# Patient Record
Sex: Female | Born: 1965 | Race: White | Hispanic: No | Marital: Married | Smoking: Never smoker
Health system: Southern US, Community
[De-identification: ages and names within clinical notes are randomized; demographics above are authoritative.]

---

## 1998-12-18 ENCOUNTER — Inpatient Hospital Stay (HOSPITAL_COMMUNITY): Admission: AD | Admit: 1998-12-18 | Discharge: 1998-12-21 | Payer: Self-pay | Admitting: Obstetrics and Gynecology

## 1999-01-22 ENCOUNTER — Other Ambulatory Visit: Admission: RE | Admit: 1999-01-22 | Discharge: 1999-01-22 | Payer: Self-pay | Admitting: Obstetrics and Gynecology

## 2014-05-10 LAB — HM PAP SMEAR: HM Pap smear: NEGATIVE

## 2014-06-30 ENCOUNTER — Emergency Department (HOSPITAL_COMMUNITY): Payer: 59

## 2014-06-30 ENCOUNTER — Encounter (HOSPITAL_COMMUNITY): Payer: Self-pay | Admitting: *Deleted

## 2014-06-30 ENCOUNTER — Emergency Department (HOSPITAL_COMMUNITY)
Admission: EM | Admit: 2014-06-30 | Discharge: 2014-06-30 | Disposition: A | Discharge status: Home / Self Care | Payer: 59 | Attending: Emergency Medicine | Admitting: Emergency Medicine

## 2014-06-30 DIAGNOSIS — S022XXA Fracture of nasal bones, initial encounter for closed fracture: Secondary | ICD-10-CM | POA: Insufficient documentation

## 2014-06-30 DIAGNOSIS — W228XXA Striking against or struck by other objects, initial encounter: Secondary | ICD-10-CM | POA: Diagnosis not present

## 2014-06-30 DIAGNOSIS — Y998 Other external cause status: Secondary | ICD-10-CM | POA: Diagnosis not present

## 2014-06-30 DIAGNOSIS — Z880 Allergy status to penicillin: Secondary | ICD-10-CM | POA: Diagnosis not present

## 2014-06-30 DIAGNOSIS — S0993XA Unspecified injury of face, initial encounter: Secondary | ICD-10-CM | POA: Diagnosis present

## 2014-06-30 DIAGNOSIS — Y9289 Other specified places as the place of occurrence of the external cause: Secondary | ICD-10-CM | POA: Diagnosis not present

## 2014-06-30 DIAGNOSIS — Y9389 Activity, other specified: Secondary | ICD-10-CM | POA: Insufficient documentation

## 2014-06-30 MED ORDER — IBUPROFEN 200 MG PO TABS
600.0000 mg | ORAL_TABLET | Freq: Once | ORAL | Status: AC
  Administered 2014-06-30: 600 mg via ORAL
  Filled 2014-06-30 (×2): qty 1

## 2014-06-30 MED ORDER — HYDROCODONE-ACETAMINOPHEN 5-325 MG PO TABS: 1.0000 | ORAL_TABLET | ORAL | Status: DC | PRN

## 2014-06-30 NOTE — Discharge Instructions (Signed)
Please follow the directions provided.  Be sure to follow-up with the Ear, Nose and Throat doctor to further manage your broken nose.  You may take ibuprofen for pain or vicodin for pain not helped by the ibuprofen.  Use the ice pack for swelling.  Don't hesitate to return for any new, worsening or concerning symptoms.     SEEK IMMEDIATE MEDICAL CARE IF:  You have bleeding from your nose that does not stop after 20 minutes of pinching the nostrils closed and keeping ice on the nose.  You have clear fluid draining from your nose.  You notice a grape-like swelling on the dividing wall between the nostrils (septum). This is a collection of blood (hematoma) that must be drained to help prevent infection.  You have difficulty moving your eyes.  You have recurrent vomiting.

## 2014-06-30 NOTE — ED Notes (Signed)
Pt reports working on a project, pulled on something and unsure if wood or hammer hit her nose. Having nose bleed and has bruising/abrasion to nose. Reports dizziness since accident, denies loc.

## 2014-06-30 NOTE — ED Provider Notes (Signed)
CSN: [id number]     Arrival date & time 06/30/14  1302 History   First MD Initiated Contact with Patient 06/30/14 1313     Chief Complaint  Patient presents with  . Facial Injury   (Consider location/radiation/quality/duration/timing/severity/associated sxs/prior Treatment) HPI  Karen DebarChristine Bautista is a 49 yo female presenting with injury to nose.  She states she was taking down a playhouse and while pulling with the hammer, the hammer struck her in the bridge of the nose.  She did not lose consciousness but her nose immediately began bleeding.  She put ice across the bridge of her nose at the time of the accident and reports it has helped with the pain and swelling. The bleeding is controlled now also. She currently reports a throbbing pain but only rates it as 2-3/10.  She denies any nausea, vomiting or changes in mental status.     History reviewed. No pertinent past medical history. History reviewed. No pertinent past surgical history. History reviewed. No pertinent family history. History  Substance Use Topics  . Smoking status: Not on file  . Smokeless tobacco: Not on file  . Alcohol Use: Yes   OB History    No data available     Review of Systems  Gastrointestinal: Negative for nausea.  Musculoskeletal: Positive for myalgias and arthralgias.  Neurological: Negative for syncope.     Allergies  Amoxicillin  Home Medications   Prior to Admission medications   Not on File   BP 125/69 mmHg  Pulse 77  Temp(Src) 98.3 F (36.8 C) (Oral)  Resp 18  SpO2 94%  LMP 06/19/2014 Physical Exam  Constitutional: She is oriented to person, place, and time. She appears well-developed and well-nourished. No distress.  HENT:  Head: Normocephalic and atraumatic.  Nose: Sinus tenderness present. No nasal deformity.    Mouth/Throat: Oropharynx is clear and moist. No oropharyngeal exudate.  Eyes: Conjunctivae are normal. Pupils are equal, round, and reactive to light.  Neck: Neck  supple. No thyromegaly present.  Cardiovascular: Normal rate, regular rhythm and intact distal pulses.   Pulmonary/Chest: Effort normal and breath sounds normal. No respiratory distress. She has no wheezes. She has no rales. She exhibits no tenderness.  Abdominal: Soft. There is no tenderness.  Musculoskeletal: She exhibits no tenderness.  Lymphadenopathy:    She has no cervical adenopathy.  Neurological: She is alert and oriented to person, place, and time. She has normal strength. No cranial nerve deficit or sensory deficit. Coordination normal. GCS eye subscore is 4. GCS verbal subscore is 5. GCS motor subscore is 6.  Cranial nerves 2-12 intact.    Skin: Skin is warm and dry. No rash noted. She is not diaphoretic.  Psychiatric: She has a normal mood and affect.  Nursing note and vitals reviewed.   ED Course  Procedures (including critical care time) Labs Review Labs Reviewed - No data to display  Imaging Review Ct Maxillofacial Wo Cm  06/30/2014   CLINICAL DATA:  Facial trauma today.  EXAM: CT MAXILLOFACIAL WITHOUT CONTRAST  TECHNIQUE: Multidetector CT imaging of the maxillofacial structures was performed. Multiplanar CT image reconstructions were also generated. A small metallic BB was placed on the right temple in order to reliably differentiate right from left.  COMPARISON:  None.  FINDINGS: There is a fracture of the bony nasal bridge and right nasal bone. Minimal depression. The left nasal bone is intact. The bony nasal septum is intact.  No other facial bone fractures are identified. The paranasal sinuses and  mastoid air cells are clear.  The mandibular condyles are normally located. The upper cervical spine is normal. Moderate degenerative changes noted at C1-2.  IMPRESSION: Nasal bone fractures as described above.  No other facial bone fractures are demonstrated.   Electronically Signed   By: Rudie Meyer M.D.   On: 06/30/2014 14:56     EKG Interpretation None      MDM    Final diagnoses:  Nasal fracture, closed, initial encounter   49 yo with non-displaced nasal fracture identified on CT.  She has no difficulty breathing and no indication of septal hematoma. Her pain was managed in the ED. Pt is well-appearing, in no acute distress and vital signs reviewed and not concerning. She appears safe to be discharged.  Discharge include follow-up with their ENT.  Return precautions provided. Pt aware of plan and in agreement.   Filed Vitals:   06/30/14 1415 06/30/14 1500 06/30/14 1515 06/30/14 1525  BP: 121/72 117/68 116/73 116/73  Pulse: 64 85 86 83  Temp:      TempSrc:      Resp:    16  SpO2: 98% 97% 97% 97%   Meds given in ED:  Medications  ibuprofen (ADVIL,MOTRIN) tablet 600 mg (600 mg Oral Given 06/30/14 1401)    Discharge Medication List as of 06/30/2014  3:30 PM    START taking these medications   Details  HYDROcodone-acetaminophen (NORCO/VICODIN) 5-325 MG per tablet Take 1 tablet by mouth every 4 (four) hours as needed for moderate pain or severe pain., Starting 06/30/2014, Until Discontinued, Print          Harle Battiest, NP 07/01/14 1106  Suzi Roots, MD 07/05/14 [phone]

## 2015-04-27 IMAGING — CT CT MAXILLOFACIAL W/O CM
3 of 4 series · 9 of 47 positions shown, 10 images · non-contrast
Comparison: None.

CLINICAL DATA: Facial trauma today.

EXAM:
CT MAXILLOFACIAL WITHOUT CONTRAST
TECHNIQUE: Multidetector CT imaging of the maxillofacial structures was
performed. Multiplanar CT image reconstructions were also generated.
A small metallic BB was placed on the right temple in order to
reliably differentiate right from left.

[Series 205: cor st · coronal · 0.33mm/px · 3 of 59 slices shown, 4 images]
[im 15/59  brain]
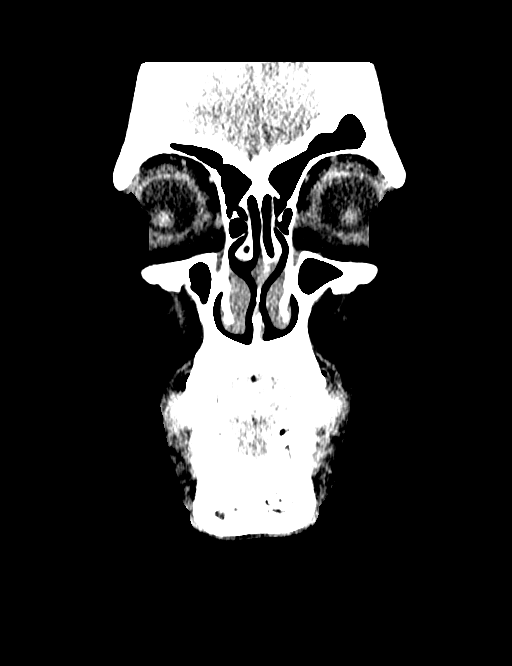
[im 15/59  bone]
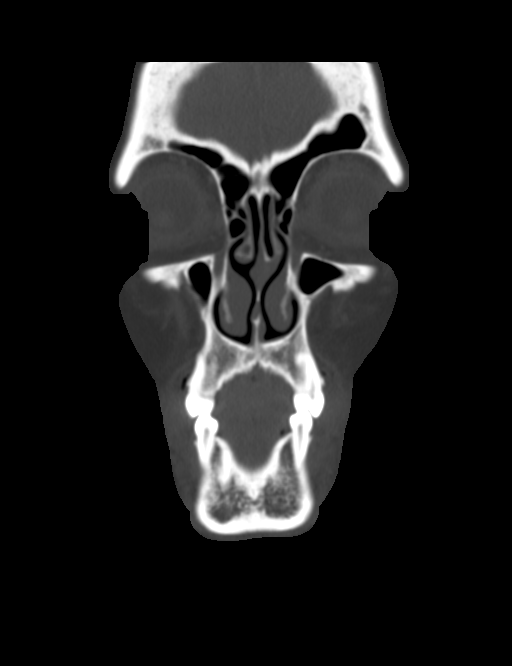
[im 30/59  bone]
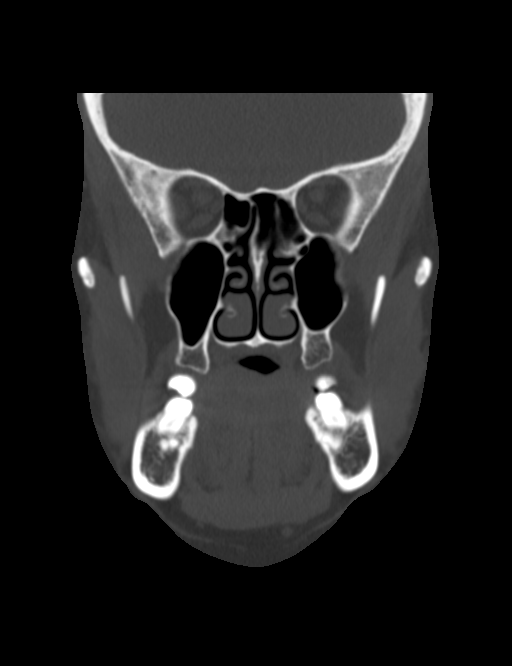
[im 44/59  bone]
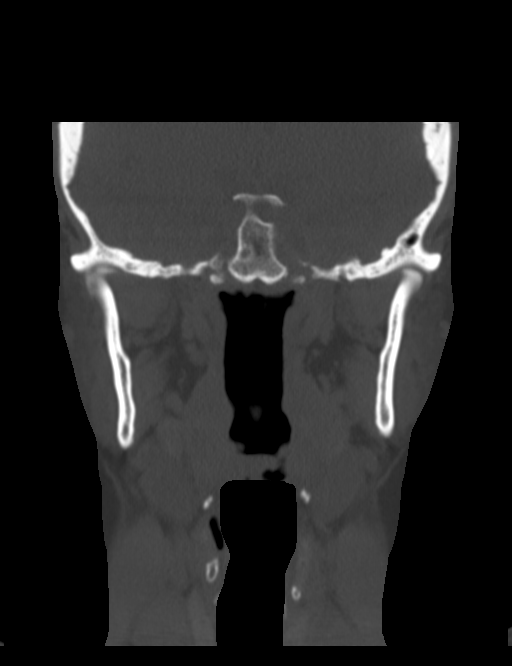

[Series 206: sag st · sagittal · 0.33mm/px · 3 of 71 slices shown]
[im 24/71  bone]
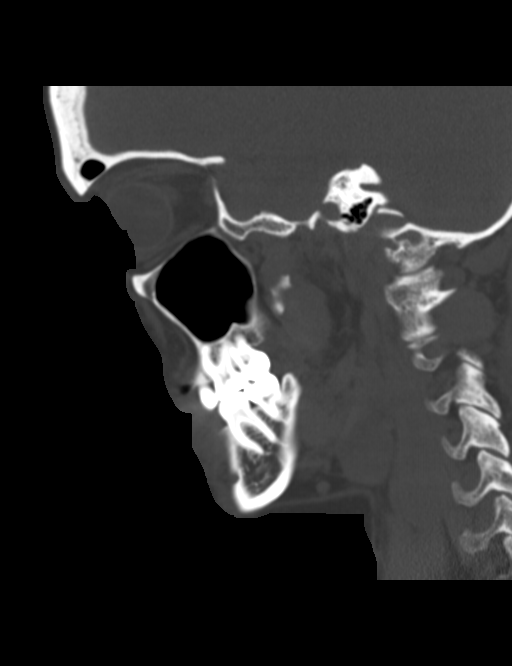
[im 36/71  bone]
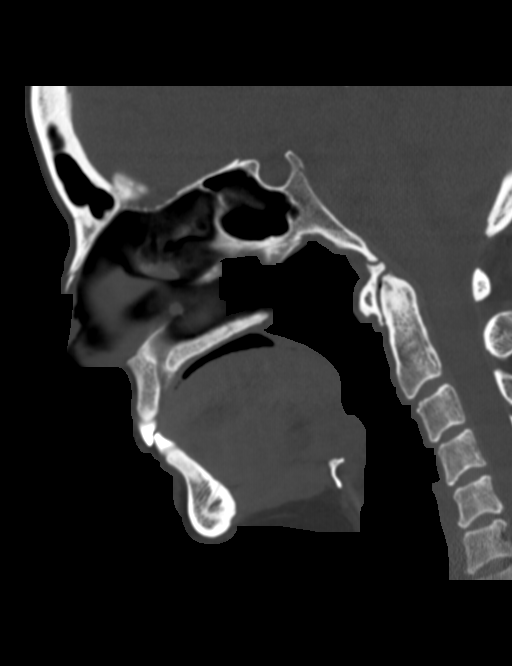
[im 47/71  bone]
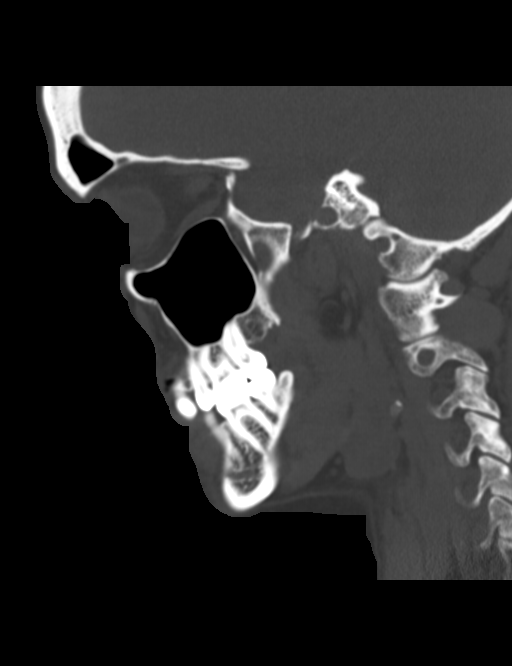

[Series 207: cor bone · coronal · 0.33mm/px · 3 of 58 slices shown]
[im 15/58  bone]
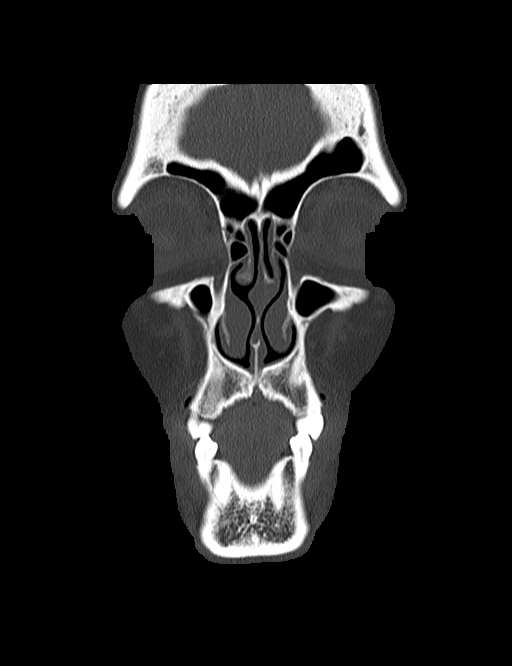
[im 29/58  bone]
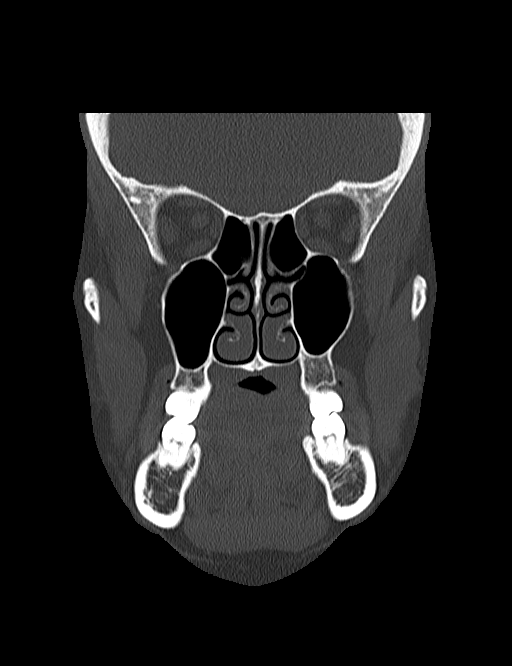
[im 43/58  bone]
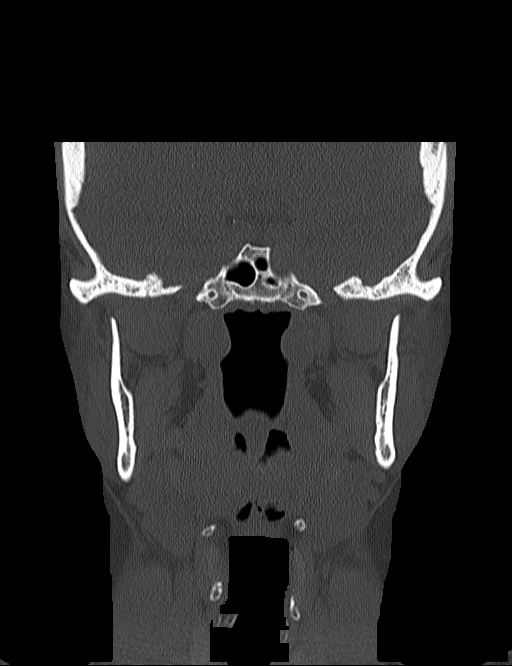

[9 of 47 positions shown; findings below may reference images not displayed]

FINDINGS: There is a fracture of the bony nasal bridge and right nasal bone.
Minimal depression. The left nasal bone is intact. The bony nasal
septum is intact.

No other facial bone fractures are identified. The paranasal sinuses
and mastoid air cells are clear.

The mandibular condyles are normally located. The upper cervical
spine is normal. Moderate degenerative changes noted at C1-2.
IMPRESSION: Nasal bone fractures as described above.

No other facial bone fractures are demonstrated.

## 2016-04-15 LAB — HM MAMMOGRAPHY

## 2016-05-20 DIAGNOSIS — Z1211 Encounter for screening for malignant neoplasm of colon: Secondary | ICD-10-CM | POA: Diagnosis not present

## 2016-07-07 DIAGNOSIS — K5730 Diverticulosis of large intestine without perforation or abscess without bleeding: Secondary | ICD-10-CM | POA: Diagnosis not present

## 2016-07-07 DIAGNOSIS — Z1211 Encounter for screening for malignant neoplasm of colon: Secondary | ICD-10-CM | POA: Diagnosis not present

## 2017-03-10 ENCOUNTER — Ambulatory Visit: Payer: 59 | Admitting: Family Medicine

## 2017-03-17 ENCOUNTER — Encounter: Payer: Self-pay | Admitting: Family Medicine

## 2017-03-17 ENCOUNTER — Ambulatory Visit (INDEPENDENT_AMBULATORY_CARE_PROVIDER_SITE_OTHER): Payer: 59 | Admitting: Family Medicine

## 2017-03-17 VITALS — BP 120/80 | HR 90 | Temp 98.5°F | Ht 65.25 in | Wt 174.9 lb

## 2017-03-17 DIAGNOSIS — Z1159 Encounter for screening for other viral diseases: Secondary | ICD-10-CM | POA: Diagnosis not present

## 2017-03-17 DIAGNOSIS — F458 Other somatoform disorders: Secondary | ICD-10-CM | POA: Diagnosis not present

## 2017-03-17 DIAGNOSIS — Z8719 Personal history of other diseases of the digestive system: Secondary | ICD-10-CM

## 2017-03-17 DIAGNOSIS — R0989 Other specified symptoms and signs involving the circulatory and respiratory systems: Secondary | ICD-10-CM

## 2017-03-17 DIAGNOSIS — Z23 Encounter for immunization: Secondary | ICD-10-CM

## 2017-03-17 DIAGNOSIS — Z0000 Encounter for general adult medical examination without abnormal findings: Secondary | ICD-10-CM | POA: Diagnosis not present

## 2017-03-17 DIAGNOSIS — Z1331 Encounter for screening for depression: Secondary | ICD-10-CM | POA: Diagnosis not present

## 2017-03-17 LAB — LIPID PANEL
CHOL/HDL RATIO: 3
CHOLESTEROL: 242 mg/dL — AB (ref 0–200)
HDL: 82.5 mg/dL (ref >39.00)
LDL Cholesterol: 142 mg/dL — ABNORMAL HIGH (ref 0–99)
NonHDL: 159.24
TRIGLYCERIDES: 88 mg/dL (ref 0.0–149.0)
VLDL: 17.6 mg/dL (ref 0.0–40.0)

## 2017-03-17 LAB — HEMOGLOBIN A1C: HEMOGLOBIN A1C: 5.6 % (ref 4.6–6.5)

## 2017-03-17 NOTE — Patient Instructions (Addendum)
BEFORE YOU LEAVE: -flu shot -depression screening for physical -labs -follow up: yearly for CPE and as needed  We have ordered labs or studies at this visit. It can take up to 1-2 weeks for results and processing. IF results require follow up or explanation, we will call you with instructions. Clinically stable results will be released to your [facility]. If you have not heard from [place] or cannot find your results in [facility] in 2 weeks please contact our office at [phone].  If you are not yet signed up for [facility], please consider signing up.  WE NOW OFFER   [facility] FAST TRACK!!!  SAME DAY Appointments for ACUTE CARE  Such as: Sprains, Injuries, cuts, abrasions, rashes, muscle pain, joint pain, back pain Colds, flu, sore throats, headache, allergies, cough, fever  Ear pain, sinus and eye infections Abdominal pain, nausea, vomiting, diarrhea, upset stomach Animal/insect bites  3 Easy Ways to Schedule: Walk-In Scheduling - will be offered next available appointment time Call in scheduling Mychart Sign-up: [email or web address]    Health Maintenance for Postmenopausal Women Menopause is a normal process in which your reproductive ability comes to an end. This process happens gradually over a span of months to years, usually between the ages of 60 and 40. Menopause is complete when you have missed 12 consecutive menstrual periods. It is important to talk with your health care provider about some of the most common conditions that affect postmenopausal women, such as heart disease, cancer, and bone loss (osteoporosis). Adopting a healthy lifestyle and getting preventive care can help to promote your health and wellness. Those actions can also lower your chances of developing some of these common conditions. What should I know about menopause? During menopause, you may experience a number of symptoms, such as:  Moderate-to-severe hot flashes.  Night  sweats.  Decrease in sex drive.  Mood swings.  Headaches.  Tiredness.  Irritability.  Memory problems.  Insomnia.  Choosing to treat or not to treat menopausal changes is an individual decision that you make with your health care provider. What should I know about hormone replacement therapy and supplements? Hormone therapy products are effective for treating symptoms that are associated with menopause, such as hot flashes and night sweats. Hormone replacement carries certain risks, especially as you become older. If you are thinking about using estrogen or estrogen with progestin treatments, discuss the benefits and risks with your health care provider. What should I know about heart disease and stroke? Heart disease, heart attack, and stroke become more likely as you age. This may be due, in part, to the hormonal changes that your body experiences during menopause. These can affect how your body processes dietary fats, triglycerides, and cholesterol. Heart attack and stroke are both medical emergencies. There are many things that you can do to help prevent heart disease and stroke:  Have your blood pressure checked at least every 1-2 years. High blood pressure causes heart disease and increases the risk of stroke.  If you are 54-86 years old, ask your health care provider if you should take aspirin to prevent a heart attack or a stroke.  Do not use any tobacco products, including cigarettes, chewing tobacco, or electronic cigarettes. If you need help quitting, ask your health care provider.  It is important to eat a healthy diet and maintain a healthy weight. ? Be sure to include plenty of vegetables, fruits, low-fat dairy products, and lean protein. ? Avoid eating foods that are high in solid fats, added sugars,  or salt (sodium).  Get regular exercise. This is one of the most important things that you can do for your health. ? Try to exercise for at least 150 minutes each week.  The type of exercise that you do should increase your heart rate and make you sweat. This is known as moderate-intensity exercise. ? Try to do strengthening exercises at least twice each week. Do these in addition to the moderate-intensity exercise.  Know your numbers.Ask your health care provider to check your cholesterol and your blood glucose. Continue to have your blood tested as directed by your health care provider.  What should I know about cancer screening? There are several types of cancer. Take the following steps to reduce your risk and to catch any cancer development as early as possible. Breast Cancer  Practice breast self-awareness. ? This means understanding how your breasts normally appear and feel. ? It also means doing regular breast self-exams. Let your health care provider know about any changes, no matter how small.  If you are 23 or older, have a clinician do a breast exam (clinical breast exam or CBE) every year. Depending on your age, family history, and medical history, it may be recommended that you also have a yearly breast X-ray (mammogram).  If you have a family history of breast cancer, talk with your health care provider about genetic screening.  If you are at high risk for breast cancer, talk with your health care provider about having an MRI and a mammogram every year.  Breast cancer (BRCA) gene test is recommended for women who have family members with BRCA-related cancers. Results of the assessment will determine the need for genetic counseling and BRCA1 and for BRCA2 testing. BRCA-related cancers include these types: ? Breast. This occurs in males or females. ? Ovarian. ? Tubal. This may also be called fallopian tube cancer. ? Cancer of the abdominal or pelvic lining (peritoneal cancer). ? Prostate. ? Pancreatic.  Cervical, Uterine, and Ovarian Cancer Your health care provider may recommend that you be screened regularly for cancer of the pelvic  organs. These include your ovaries, uterus, and vagina. This screening involves a pelvic exam, which includes checking for microscopic changes to the surface of your cervix (Pap test).  For women ages 21-65, health care providers may recommend a pelvic exam and a Pap test every three years. For women ages 40-65, they may recommend the Pap test and pelvic exam, combined with testing for human papilloma virus (HPV), every five years. Some types of HPV increase your risk of cervical cancer. Testing for HPV may also be done on women of any age who have unclear Pap test results.  Other health care providers may not recommend any screening for nonpregnant women who are considered low risk for pelvic cancer and have no symptoms. Ask your health care provider if a screening pelvic exam is right for you.  If you have had past treatment for cervical cancer or a condition that could lead to cancer, you need Pap tests and screening for cancer for at least 20 years after your treatment. If Pap tests have been discontinued for you, your risk factors (such as having a new sexual partner) need to be reassessed to determine if you should start having screenings again. Some women have medical problems that increase the chance of getting cervical cancer. In these cases, your health care provider may recommend that you have screening and Pap tests more often.  If you have a family history of  uterine cancer or ovarian cancer, talk with your health care provider about genetic screening.  If you have vaginal bleeding after reaching menopause, tell your health care provider.  There are currently no reliable tests available to screen for ovarian cancer.  Lung Cancer Lung cancer screening is recommended for adults 45-87 years old who are at high risk for lung cancer because of a history of smoking. A yearly low-dose CT scan of the lungs is recommended if you:  Currently smoke.  Have a history of at least 30 pack-years of  smoking and you currently smoke or have quit within the past 15 years. A pack-year is smoking an average of one pack of cigarettes per day for one year.  Yearly screening should:  Continue until it has been 15 years since you quit.  Stop if you develop a health problem that would prevent you from having lung cancer treatment.  Colorectal Cancer  This type of cancer can be detected and can often be prevented.  Routine colorectal cancer screening usually begins at age 21 and continues through age 2.  If you have risk factors for colon cancer, your health care provider may recommend that you be screened at an earlier age.  If you have a family history of colorectal cancer, talk with your health care provider about genetic screening.  Your health care provider may also recommend using home test kits to check for hidden blood in your stool.  A small camera at the end of a tube can be used to examine your colon directly (sigmoidoscopy or colonoscopy). This is done to check for the earliest forms of colorectal cancer.  Direct examination of the colon should be repeated every 5-10 years until age 71. However, if early forms of precancerous polyps or small growths are found or if you have a family history or genetic risk for colorectal cancer, you may need to be screened more often.  Skin Cancer  Check your skin from head to toe regularly.  Monitor any moles. Be sure to tell your health care provider: ? About any new moles or changes in moles, especially if there is a change in a mole's shape or color. ? If you have a mole that is larger than the size of a pencil eraser.  If any of your family members has a history of skin cancer, especially at a young age, talk with your health care provider about genetic screening.  Always use sunscreen. Apply sunscreen liberally and repeatedly throughout the day.  Whenever you are outside, protect yourself by wearing long sleeves, pants, a wide-brimmed  hat, and sunglasses.  What should I know about osteoporosis? Osteoporosis is a condition in which bone destruction happens more quickly than new bone creation. After menopause, you may be at an increased risk for osteoporosis. To help prevent osteoporosis or the bone fractures that can happen because of osteoporosis, the following is recommended:  If you are 34-5 years old, get at least 1,000 mg of calcium and at least 600 mg of vitamin D per day.  If you are older than age 37 but younger than age 26, get at least 1,200 mg of calcium and at least 600 mg of vitamin D per day.  If you are older than age 9, get at least 1,200 mg of calcium and at least 800 mg of vitamin D per day.  Smoking and excessive alcohol intake increase the risk of osteoporosis. Eat foods that are rich in calcium and vitamin D, and do  weight-bearing exercises several times each week as directed by your health care provider. What should I know about how menopause affects my mental health? Depression may occur at any age, but it is more common as you become older. Common symptoms of depression include:  Low or sad mood.  Changes in sleep patterns.  Changes in appetite or eating patterns.  Feeling an overall lack of motivation or enjoyment of activities that you previously enjoyed.  Frequent crying spells.  Talk with your health care provider if you think that you are experiencing depression. What should I know about immunizations? It is important that you get and maintain your immunizations. These include:  Tetanus, diphtheria, and pertussis (Tdap) booster vaccine.  Influenza every year before the flu season begins.  Pneumonia vaccine.  Shingles vaccine.  Your health care provider may also recommend other immunizations. This information is not intended to replace advice given to you by your health care provider. Make sure you discuss any questions you have with your health care provider. Document Released:  06/18/2005 Document Revised: 11/14/2015 Document Reviewed: 01/28/2015 Elsevier Interactive Patient Education  2018 [employer].

## 2017-03-17 NOTE — Addendum Note (Signed)
Addended by: Johnella MoloneyFUNDERBURK, Siyah Mault A on: 03/17/2017 11:36 AM   Modules accepted: Orders

## 2017-03-17 NOTE — Progress Notes (Signed)
HPI:  Karen Bautista is here to establish care. Sees gynecologist (dr. Lauro Franklin) for women's health exams. Did not have a PCP for some time. Wants physical today if possible. Does not want pap/breast/skin exam. Feels well over all. Had mildly sore stomach and didn't feel well early this week, now resolved. No fever, hematochezia, melena, nausea, vomiting, diarrhea. Globus sensation 1-2 times in last 1 year, brief and resolved. No persistent GERD symptoms, dysphagia, wt loss, etc. Eats healthy. Regular exercise - very active when plays tennis.  Does breast exams, mammos, gyn exams with gynecologist. Reports UTD. Did colonoscopy at [place] this year in February. Takes vit D. Wants to check hep c as she read about this and thinks should do even though she is 1 year outside of baby boomer recs as she had several surgeries remotely.  ROS negative for unless reported above: fevers, unintentional weight loss, hearing or vision loss, chest pain, palpitations, struggling to breath, hemoptysis, melena, hematochezia, hematuria, falls, loc, si, thoughts of self harm  History reviewed. No pertinent past medical history.  History reviewed. No pertinent surgical history.  History reviewed. No pertinent family history.  Social History   Socioeconomic History  . Marital status: Married    Spouse name: None  . Number of children: None  . Years of education: None  . Highest education level: None  Social Needs  . Financial resource strain: None  . Food insecurity - worry: None  . Food insecurity - inability: None  . Transportation needs - medical: None  . Transportation needs - non-medical: None  Occupational History  . None  Tobacco Use  . Smoking status: Never Smoker  . Smokeless tobacco: Never Used  Substance and Sexual Activity  . Alcohol use: Yes  . Drug use: No  . Sexual activity: None  Other Topics Concern  . None  Social History Narrative  . None     Current  Outpatient Medications:  .  CALCIUM PO, Take by mouth., Disp: , Rfl:  .  cholecalciferol (VITAMIN D) 1000 UNITS tablet, Take 1,000 Units by mouth daily., Disp: , Rfl:  .  diphenhydramine-acetaminophen (TYLENOL PM) 25-500 MG TABS tablet, Take 1 tablet at bedtime as needed by mouth., Disp: , Rfl:  .  Multiple Vitamins-Minerals (MULTIVITAMIN WITH MINERALS) tablet, Take 1 tablet by mouth daily., Disp: , Rfl:   EXAM:  Vitals:   03/17/17 1027  BP: 120/80  Pulse: 90  Temp: 98.5 F (36.9 C)    Body mass index is 28.88 kg/m.  GENERAL: vitals reviewed and listed above, alert, oriented, appears well hydrated and in no acute distress  HEENT: atraumatic, conjunttiva clear, no obvious abnormalities on inspection of external nose and ears  NECK: no obvious masses on inspection  LUNGS: clear to auscultation bilaterally, no wheezes, rales or rhonchi, good air movement  CV: HRRR, no peripheral edema  MS: moves all extremities without noticeable abnormality  PSYCH: pleasant and cooperative, no obvious depression or anxiety  ASSESSMENT AND PLAN:  Discussed the following assessment and plan:  Encounter for preventive health examination - Plan: Hemoglobin A1c, Lipid panel  Globus sensation  History of diverticulosis  Need for hepatitis C screening test - Plan: Hepatitis C antibody -We reviewed the PMH, PSH, FH, SH, Meds and Allergies. -We provided refills for any medications we will prescribe as needed. -preventive care measures reviewed and discussed -labs per orders -follow up abd issues and globus sensation if recurs, new concerns, etc -flu shot -gyn exam with her gynecologist  -  Patient advised to return or notify a doctor immediately if symptoms worsen or persist or new concerns arise.  Patient Instructions  BEFORE YOU LEAVE: -flu shot -depression screening for physical -labs -follow up: yearly for CPE and as needed  We have ordered labs or studies at this visit. It can  take up to 1-2 weeks for results and processing. IF results require follow up or explanation, we will call you with instructions. Clinically stable results will be released to your [facility]. If you have not heard from [place] or cannot find your results in [facility] in 2 weeks please contact our office at [phone].  If you are not yet signed up for [facility], please consider signing up.  WE NOW OFFER   [facility] FAST TRACK!!!  SAME DAY Appointments for ACUTE CARE  Such as: Sprains, Injuries, cuts, abrasions, rashes, muscle pain, joint pain, back pain Colds, flu, sore throats, headache, allergies, cough, fever  Ear pain, sinus and eye infections Abdominal pain, nausea, vomiting, diarrhea, upset stomach Animal/insect bites  3 Easy Ways to Schedule: Walk-In Scheduling - will be offered next available appointment time Call in scheduling Mychart Sign-up: [email or web address]    Health Maintenance for Postmenopausal Women Menopause is a normal process in which your reproductive ability comes to an end. This process happens gradually over a span of months to years, usually between the ages of 31 and 2. Menopause is complete when you have missed 12 consecutive menstrual periods. It is important to talk with your health care provider about some of the most common conditions that affect postmenopausal women, such as heart disease, cancer, and bone loss (osteoporosis). Adopting a healthy lifestyle and getting preventive care can help to promote your health and wellness. Those actions can also lower your chances of developing some of these common conditions. What should I know about menopause? During menopause, you may experience a number of symptoms, such as:  Moderate-to-severe hot flashes.  Night sweats.  Decrease in sex drive.  Mood swings.  Headaches.  Tiredness.  Irritability.  Memory problems.  Insomnia.  Choosing to treat or not to treat menopausal changes  is an individual decision that you make with your health care provider. What should I know about hormone replacement therapy and supplements? Hormone therapy products are effective for treating symptoms that are associated with menopause, such as hot flashes and night sweats. Hormone replacement carries certain risks, especially as you become older. If you are thinking about using estrogen or estrogen with progestin treatments, discuss the benefits and risks with your health care provider. What should I know about heart disease and stroke? Heart disease, heart attack, and stroke become more likely as you age. This may be due, in part, to the hormonal changes that your body experiences during menopause. These can affect how your body processes dietary fats, triglycerides, and cholesterol. Heart attack and stroke are both medical emergencies. There are many things that you can do to help prevent heart disease and stroke:  Have your blood pressure checked at least every 1-2 years. High blood pressure causes heart disease and increases the risk of stroke.  If you are 10-28 years old, ask your health care provider if you should take aspirin to prevent a heart attack or a stroke.  Do not use any tobacco products, including cigarettes, chewing tobacco, or electronic cigarettes. If you need help quitting, ask your health care provider.  It is important to eat a healthy diet and maintain a healthy weight. ? Be sure to  include plenty of vegetables, fruits, low-fat dairy products, and lean protein. ? Avoid eating foods that are high in solid fats, added sugars, or salt (sodium).  Get regular exercise. This is one of the most important things that you can do for your health. ? Try to exercise for at least 150 minutes each week. The type of exercise that you do should increase your heart rate and make you sweat. This is known as moderate-intensity exercise. ? Try to do strengthening exercises at least twice  each week. Do these in addition to the moderate-intensity exercise.  Know your numbers.Ask your health care provider to check your cholesterol and your blood glucose. Continue to have your blood tested as directed by your health care provider.  What should I know about cancer screening? There are several types of cancer. Take the following steps to reduce your risk and to catch any cancer development as early as possible. Breast Cancer  Practice breast self-awareness. ? This means understanding how your breasts normally appear and feel. ? It also means doing regular breast self-exams. Let your health care provider know about any changes, no matter how small.  If you are 34 or older, have a clinician do a breast exam (clinical breast exam or CBE) every year. Depending on your age, family history, and medical history, it may be recommended that you also have a yearly breast X-ray (mammogram).  If you have a family history of breast cancer, talk with your health care provider about genetic screening.  If you are at high risk for breast cancer, talk with your health care provider about having an MRI and a mammogram every year.  Breast cancer (BRCA) gene test is recommended for women who have family members with BRCA-related cancers. Results of the assessment will determine the need for genetic counseling and BRCA1 and for BRCA2 testing. BRCA-related cancers include these types: ? Breast. This occurs in males or females. ? Ovarian. ? Tubal. This may also be called fallopian tube cancer. ? Cancer of the abdominal or pelvic lining (peritoneal cancer). ? Prostate. ? Pancreatic.  Cervical, Uterine, and Ovarian Cancer Your health care provider may recommend that you be screened regularly for cancer of the pelvic organs. These include your ovaries, uterus, and vagina. This screening involves a pelvic exam, which includes checking for microscopic changes to the surface of your cervix (Pap  test).  For women ages 21-65, health care providers may recommend a pelvic exam and a Pap test every three years. For women ages 57-65, they may recommend the Pap test and pelvic exam, combined with testing for human papilloma virus (HPV), every five years. Some types of HPV increase your risk of cervical cancer. Testing for HPV may also be done on women of any age who have unclear Pap test results.  Other health care providers may not recommend any screening for nonpregnant women who are considered low risk for pelvic cancer and have no symptoms. Ask your health care provider if a screening pelvic exam is right for you.  If you have had past treatment for cervical cancer or a condition that could lead to cancer, you need Pap tests and screening for cancer for at least 20 years after your treatment. If Pap tests have been discontinued for you, your risk factors (such as having a new sexual partner) need to be reassessed to determine if you should start having screenings again. Some women have medical problems that increase the chance of getting cervical cancer. In these cases,  your health care provider may recommend that you have screening and Pap tests more often.  If you have a family history of uterine cancer or ovarian cancer, talk with your health care provider about genetic screening.  If you have vaginal bleeding after reaching menopause, tell your health care provider.  There are currently no reliable tests available to screen for ovarian cancer.  Lung Cancer Lung cancer screening is recommended for adults 41-93 years old who are at high risk for lung cancer because of a history of smoking. A yearly low-dose CT scan of the lungs is recommended if you:  Currently smoke.  Have a history of at least 30 pack-years of smoking and you currently smoke or have quit within the past 15 years. A pack-year is smoking an average of one pack of cigarettes per day for one year.  Yearly screening  should:  Continue until it has been 15 years since you quit.  Stop if you develop a health problem that would prevent you from having lung cancer treatment.  Colorectal Cancer  This type of cancer can be detected and can often be prevented.  Routine colorectal cancer screening usually begins at age 76 and continues through age 18.  If you have risk factors for colon cancer, your health care provider may recommend that you be screened at an earlier age.  If you have a family history of colorectal cancer, talk with your health care provider about genetic screening.  Your health care provider may also recommend using home test kits to check for hidden blood in your stool.  A small camera at the end of a tube can be used to examine your colon directly (sigmoidoscopy or colonoscopy). This is done to check for the earliest forms of colorectal cancer.  Direct examination of the colon should be repeated every 5-10 years until age 84. However, if early forms of precancerous polyps or small growths are found or if you have a family history or genetic risk for colorectal cancer, you may need to be screened more often.  Skin Cancer  Check your skin from head to toe regularly.  Monitor any moles. Be sure to tell your health care provider: ? About any new moles or changes in moles, especially if there is a change in a mole's shape or color. ? If you have a mole that is larger than the size of a pencil eraser.  If any of your family members has a history of skin cancer, especially at a young age, talk with your health care provider about genetic screening.  Always use sunscreen. Apply sunscreen liberally and repeatedly throughout the day.  Whenever you are outside, protect yourself by wearing long sleeves, pants, a wide-brimmed hat, and sunglasses.  What should I know about osteoporosis? Osteoporosis is a condition in which bone destruction happens more quickly than new bone creation. After  menopause, you may be at an increased risk for osteoporosis. To help prevent osteoporosis or the bone fractures that can happen because of osteoporosis, the following is recommended:  If you are 52-54 years old, get at least 1,000 mg of calcium and at least 600 mg of vitamin D per day.  If you are older than age 53 but younger than age 59, get at least 1,200 mg of calcium and at least 600 mg of vitamin D per day.  If you are older than age 98, get at least 1,200 mg of calcium and at least 800 mg of vitamin D per day.  Smoking and excessive alcohol intake increase the risk of osteoporosis. Eat foods that are rich in calcium and vitamin D, and do weight-bearing exercises several times each week as directed by your health care provider. What should I know about how menopause affects my mental health? Depression may occur at any age, but it is more common as you become older. Common symptoms of depression include:  Low or sad mood.  Changes in sleep patterns.  Changes in appetite or eating patterns.  Feeling an overall lack of motivation or enjoyment of activities that you previously enjoyed.  Frequent crying spells.  Talk with your health care provider if you think that you are experiencing depression. What should I know about immunizations? It is important that you get and maintain your immunizations. These include:  Tetanus, diphtheria, and pertussis (Tdap) booster vaccine.  Influenza every year before the flu season begins.  Pneumonia vaccine.  Shingles vaccine.  Your health care provider may also recommend other immunizations. This information is not intended to replace advice given to you by your health care provider. Make sure you discuss any questions you have with your health care provider. Document Released: 06/18/2005 Document Revised: 11/14/2015 Document Reviewed: 01/28/2015 Elsevier Interactive Patient Education  2018 [place],  [place]

## 2017-03-18 LAB — HEPATITIS C ANTIBODY
HEP C AB: NONREACTIVE
SIGNAL TO CUT-OFF: 0.01 (ref <1.00)

## 2017-03-21 ENCOUNTER — Encounter: Payer: Self-pay | Admitting: Family Medicine

## 2017-05-19 ENCOUNTER — Encounter: Payer: Self-pay | Admitting: Family Medicine

## 2017-06-23 ENCOUNTER — Ambulatory Visit: Payer: 59 | Admitting: Family Medicine

## 2017-07-11 NOTE — Progress Notes (Signed)
HPI:    Follow-up hyperlipidemia: -Discovered on labs 03/2017 -Lifestyle recommendations advised -Reports: Has made some significant changes in her diet, including decreasing red meat to a few times per month, decreasing dairy by about 50%, increasing fish consumption to a few times a week.  She reports she does not eat a lot of processed foods.  Eats a lot of veggies and is trying to eat some fruit daily as well.  During the winter she plays tennis once a week.  She is more active during other times of the year. -Denies: Chest pain, shortness of breath, swelling -She would prefer to treat this with lifestyle changes rather than medication if possible -She does not drink unfiltered coffee, she has stopped eating coconut oil -Denies any significant family history of heart disease  She declined HIV screening. She reports her mammogram is scheduled.  ROS: See pertinent positives and negatives per HPI.  History reviewed. No pertinent past medical history.  History reviewed. No pertinent surgical history.  History reviewed. No pertinent family history.  Social History   Socioeconomic History  . Marital status: Married    Spouse name: None  . Number of children: None  . Years of education: None  . Highest education level: None  Social Needs  . Financial resource strain: None  . Food insecurity - worry: None  . Food insecurity - inability: None  . Transportation needs - medical: None  . Transportation needs - non-medical: None  Occupational History  . None  Tobacco Use  . Smoking status: Never Smoker  . Smokeless tobacco: Never Used  Substance and Sexual Activity  . Alcohol use: Yes  . Drug use: No  . Sexual activity: None  Other Topics Concern  . None  Social History Narrative  . None     Current Outpatient Medications:  .  CALCIUM PO, Take by mouth., Disp: , Rfl:  .  cholecalciferol (VITAMIN D) 1000 UNITS tablet, Take 1,000 Units by mouth daily., Disp: , Rfl:  .   diphenhydramine-acetaminophen (TYLENOL PM) 25-500 MG TABS tablet, Take 1 tablet at bedtime as needed by mouth., Disp: , Rfl:  .  Multiple Vitamins-Minerals (MULTIVITAMIN WITH MINERALS) tablet, Take 1 tablet by mouth daily., Disp: , Rfl:   EXAM:  Vitals:   07/12/17 0731  BP: 100/76  Pulse: 70  Temp: 98.3 F (36.8 C)    Body mass index is 28.65 kg/m.  GENERAL: vitals reviewed and listed above, alert, oriented, appears well hydrated and in no acute distress  HEENT: atraumatic, conjunttiva clear, no obvious abnormalities on inspection of external nose and ears  NECK: no obvious masses on inspection  LUNGS: clear to auscultation bilaterally, no wheezes, rales or rhonchi, good air movement  CV: HRRR, no peripheral edema  MS: moves all extremities without noticeable abnormality  PSYCH: pleasant and cooperative, no obvious depression or anxiety  ASSESSMENT AND PLAN:  Discussed the following assessment and plan:  Hyperlipidemia, unspecified hyperlipidemia type - Plan: Lipid panel  BMI 28.0-28.9,adult  Healthcare maintenance  -She wants to recheck her labs today, lipid panel ordered -Discussed lifestyle changes at length, congratulated her on changes, encouraged her to continue these for life -Discussed risks/benefits pharmacological and nonpharmacological treatments -Encouraged her to increase her aerobic exercise during the winter -Follow-up at physical in November -Reviewed and advised on health maintenance that is due   Patient Instructions  BEFORE YOU LEAVE: -Labs -follow up: Physical exam in November  Continue to eat a healthy diet and get regular aerobic exercise.  We have ordered labs or studies at this visit. It can take up to 1-2 weeks for results and processing. IF results require follow up or explanation, we will call you with instructions. Clinically stable results will be released to your [facility]. If you have not heard from us or cannot find your results in  [facility] in 2 weeks please contact our office at [phone].  If you are not yet signed up for [facility], please consider signing up.  We recommend the following healthy lifestyle for LIFE: 1) Small portions. But, make sure to get regular (at least 3 per day), healthy meals and small healthy snacks if needed.  2) Eat a healthy clean diet.   TRY TO EAT: -at least 5-7 servings of low sugar, colorful, and nutrient rich vegetables per day (not corn, potatoes or bananas.) -berries are the best choice if you wish to eat fruit (only eat small amounts if trying to reduce weight)  -lean meets (fish, white meat of chicken or [place]key) -vegan proteins for some meals - beans or tofu, whole grains, nuts and seeds -Replace bad fats with good fats - good fats include: fish, nuts and seeds, canola oil, olive oil -small amounts of low fat or non fat dairy -small amounts of100 % whole grains - check the lables -drink plenty of water  AVOID: -SUGAR, sweets, anything with added sugar, corn syrup or sweeteners - must read labels as even foods advertised as "healthy" often are loaded with sugar -if you must have a sweetener, small amounts of stevia may be best -sweetened beverages and artificially sweetened beverages -simple starches (rice, bread, potatoes, pasta, chips, etc - small amounts of 100% whole grains are ok) -red meat, pork, butter -fried foods, fast food, processed food, excessive dairy, eggs and coconut.  3)Get at least 150 minutes of sweaty aerobic exercise per week.  4)Reduce stress - consider counseling, meditation and relaxation to balance other aspects of your life.           Karen KoyanagiHannah R Nicholl Onstott, DO

## 2017-07-12 ENCOUNTER — Encounter: Payer: Self-pay | Admitting: Family Medicine

## 2017-07-12 ENCOUNTER — Ambulatory Visit (INDEPENDENT_AMBULATORY_CARE_PROVIDER_SITE_OTHER): Payer: 59 | Admitting: Family Medicine

## 2017-07-12 VITALS — BP 100/76 | HR 70 | Temp 98.3°F | Ht 65.25 in | Wt 173.5 lb

## 2017-07-12 DIAGNOSIS — E785 Hyperlipidemia, unspecified: Secondary | ICD-10-CM | POA: Diagnosis not present

## 2017-07-12 DIAGNOSIS — Z0000 Encounter for general adult medical examination without abnormal findings: Secondary | ICD-10-CM

## 2017-07-12 DIAGNOSIS — Z6828 Body mass index (BMI) 28.0-28.9, adult: Secondary | ICD-10-CM | POA: Diagnosis not present

## 2017-07-12 LAB — LIPID PANEL
Cholesterol: 206 mg/dL — ABNORMAL HIGH (ref 0–200)
HDL: 73.6 mg/dL (ref >39.00)
LDL CALC: 111 mg/dL — AB (ref 0–99)
NonHDL: 132.64
Total CHOL/HDL Ratio: 3
Triglycerides: 109 mg/dL (ref 0.0–149.0)
VLDL: 21.8 mg/dL (ref 0.0–40.0)

## 2017-07-12 NOTE — Patient Instructions (Signed)
BEFORE YOU LEAVE: -Labs -follow up: Physical exam in November  Continue to eat a healthy diet and get regular aerobic exercise.  We have ordered labs or studies at this visit. It can take up to 1-2 weeks for results and processing. IF results require follow up or explanation, we will call you with instructions. Clinically stable results will be released to your [facility]. If you have not heard from us or cannot find your results in [facility] in 2 weeks please contact our office at [phone].  If you are not yet signed up for [facility], please consider signing up.  We recommend the following healthy lifestyle for LIFE: 1) Small portions. But, make sure to get regular (at least 3 per day), healthy meals and small healthy snacks if needed.  2) Eat a healthy clean diet.   TRY TO EAT: -at least 5-7 servings of low sugar, colorful, and nutrient rich vegetables per day (not corn, potatoes or bananas.) -berries are the best choice if you wish to eat fruit (only eat small amounts if trying to reduce weight)  -lean meets (fish, white meat of chicken or [place]) -vegan proteins for some meals - beans or tofu, whole grains, nuts and seeds -Replace bad fats with good fats - good fats include: fish, nuts and seeds, canola oil, olive oil -small amounts of low fat or non fat dairy -small amounts of100 % whole grains - check the lables -drink plenty of water  AVOID: -SUGAR, sweets, anything with added sugar, corn syrup or sweeteners - must read labels as even foods advertised as "healthy" often are loaded with sugar -if you must have a sweetener, small amounts of stevia may be best -sweetened beverages and artificially sweetened beverages -simple starches (rice, bread, potatoes, pasta, chips, etc - small amounts of 100% whole grains are ok) -red meat, pork, butter -fried foods, fast food, processed food, excessive dairy, eggs and coconut.  3)Get at least 150 minutes of sweaty aerobic exercise per  week.  4)Reduce stress - consider counseling, meditation and relaxation to balance other aspects of your life.

## 2017-07-22 DIAGNOSIS — Z01419 Encounter for gynecological examination (general) (routine) without abnormal findings: Secondary | ICD-10-CM | POA: Diagnosis not present

## 2017-07-22 LAB — HM PAP SMEAR

## 2018-03-20 ENCOUNTER — Encounter: Payer: 59 | Admitting: Family Medicine

## 2018-05-15 NOTE — Progress Notes (Signed)
HPI:  Using dictation device. Unfortunately this device frequently misinterprets words/phrases.  Here for CPE:  -Concerns and/or follow up today:   Hx hyperlipidemia. Much better last check with lifestyle changes. Sees gyn, Dr. Valentino Saxon for breast, women's health. Reports had gyn exam and mammogram with gyn 07/22/2017.  -Diet: variety of foods, balance and well rounded -Exercise: tennis 2x per week -Taking folic acid, vitamin D or calcium: no -Diabetes and Dyslipidemia Screening: fasting for labs -Vaccines: see vaccine section EPIC -pap history: sees gyn yearly, reports up to date -FDLMP: see nursing notes -sexual activity:not discussed, sees gyn -wants STI testing (Hep C if born [birth year]): no -FH breast, colon or ovarian ca: see FH Last mammogram: sees gyn Last colon cancer screening: did colonoscopy with [facility] per her report in feb 2018 Breast Ca Risk Assessment: see family history and pt history DEXA (>/= 65): n/a  -Alcohol, Tobacco, drug use: see social history  Review of Systems - no fevers, unintentional weight loss, vision loss, hearing loss, chest pain, sob, hemoptysis, melena, hematochezia, hematuria, genital discharge, changing or concerning skin lesions, bleeding, bruising, loc, thoughts of self harm or SI  History reviewed. No pertinent past medical history.  History reviewed. No pertinent surgical history.  History reviewed. No pertinent family history.  Social History   Socioeconomic History  . Marital status: Married    Spouse name: Not on file  . Number of children: Not on file  . Years of education: Not on file  . Highest education level: Not on file  Occupational History  . Not on file  Social Needs  . Financial resource strain: Not on file  . Food insecurity:    Worry: Not on file    Inability: Not on file  . Transportation needs:    Medical: Not on file    Non-medical: Not on file  Tobacco Use  . Smoking status: Never Smoker  .  Smokeless tobacco: Never Used  Substance and Sexual Activity  . Alcohol use: Yes  . Drug use: No  . Sexual activity: Not on file  Lifestyle  . Physical activity:    Days per week: Not on file    Minutes per session: Not on file  . Stress: Not on file  Relationships  . Social connections:    Talks on phone: Not on file    Gets together: Not on file    Attends religious service: Not on file    Active member of club or organization: Not on file    Attends meetings of clubs or organizations: Not on file    Relationship status: Not on file  Other Topics Concern  . Not on file  Social History Narrative  . Not on file     Current Outpatient Medications:  .  CALCIUM PO, Take by mouth., Disp: , Rfl:  .  cholecalciferol (VITAMIN D) 1000 UNITS tablet, Take 1,000 Units by mouth daily., Disp: , Rfl:  .  diphenhydramine-acetaminophen (TYLENOL PM) 25-500 MG TABS tablet, Take 1 tablet at bedtime as needed by mouth., Disp: , Rfl:  .  Multiple Vitamins-Minerals (MULTIVITAMIN WITH MINERALS) tablet, Take 1 tablet by mouth daily., Disp: , Rfl:   EXAM:  Vitals:   05/16/18 0821  BP: 90/70  Pulse: 82  Temp: 98.3 F (36.8 C)    GENERAL: vitals reviewed and listed below, alert, oriented, appears well hydrated and in no acute distress  HEENT: head atraumatic, PERRLA, normal appearance of eyes, ears, nose and mouth. moist mucus membranes.  NECK: supple, no masses or lymphadenopathy  LUNGS: clear to auscultation bilaterally, no rales, rhonchi or wheeze  CV: HRRR, no peripheral edema or cyanosis, normal pedal pulses  ABDOMEN: bowel sounds normal, soft, non tender to palpation, no masses, no rebound or guarding  GU/BREAST: declined, sees gyn  SKIN: no rash or abnormal lesions - declined offered full skin exam  MS: normal gait, moves all extremities normally  NEURO: normal gait, speech and thought processing grossly intact, muscle tone grossly intact throughout  PSYCH: normal affect,  pleasant and cooperative  ASSESSMENT AND PLAN:  Discussed the following assessment and plan:  PREVENTIVE EXAM: -Discussed and advised all [place] preventive services health task force level A and B recommendations for age, sex and risks. -Advised at least 150 minutes of exercise per week and a healthy diet -labs, studies and vaccines per orders this encounter (flu shot and tet booster today, fasting labs) -advised assistant to obtain mammogram, pap, gyn data and abstract -advised follow up in 1 year for CPE   Patient advised to return to clinic as needed in interim if any concerns.  Patient Instructions  BEFORE YOU LEAVE: -flu shot -tetanus booster -obtain mammogram, pap data from gyn -follow up: yearly for physical, sooner as needed  We have ordered labs or studies at this visit. It can take up to 1-2 weeks for results and processing. IF results require follow up or explanation, we will call you with instructions. Clinically stable results will be released to your [facility]. If you have not heard from [place] or cannot find your results in [facility] in 2 weeks please contact our office at [phone].  If you are not yet signed up for [facility], please consider signing up.   Preventive Care 40-64 Years, Female Preventive care refers to lifestyle choices and visits with your health care provider that can promote health and wellness. What does preventive care include?   A yearly physical exam. This is also called an annual well check.  Dental exams once or twice a year.  Routine eye exams. Ask your health care provider how often you should have your eyes checked.  Personal lifestyle choices, including: ? Daily care of your teeth and gums. ? Regular physical activity. ? Eating a healthy diet. ? Avoiding tobacco and drug use. ? Limiting alcohol use. ? Practicing safe sex. ? Taking vitamin and mineral supplements as recommended by your health care provider. What happens during an annual  well check? The services and screenings done by your health care provider during your annual well check will depend on your age, overall health, lifestyle risk factors, and family history of disease. Counseling Your health care provider may ask you questions about your:  Alcohol use.  Tobacco use.  Drug use.  Emotional well-being.  Home and relationship well-being.  Sexual activity.  Eating habits.  Work and work Statistician.  Method of birth control.  Menstrual cycle.  Pregnancy history. Screening You may have the following tests or measurements:  Height, weight, and BMI.  Blood pressure.  Lipid and cholesterol levels. These may be checked every 5 years, or more frequently if you are over 27 years old.  Skin check.  Lung cancer screening. You may have this screening every year starting at age 72 if you have a 30-pack-year history of smoking and currently smoke or have quit within the past 15 years.  Colorectal cancer screening. All adults should have this screening starting at age 55 and continuing until age 78. Your health care provider  may recommend screening at age 46. You will have tests every 1-10 years, depending on your results and the type of screening test. People at increased risk should start screening at an earlier age. Screening tests may include: ? Guaiac-based fecal occult blood testing. ? Fecal immunochemical test (FIT). ? Stool DNA test. ? Virtual colonoscopy. ? Sigmoidoscopy. During this test, a flexible tube with a tiny camera (sigmoidoscope) is used to examine your rectum and lower colon. The sigmoidoscope is inserted through your anus into your rectum and lower colon. ? Colonoscopy. During this test, a long, thin, flexible tube with a tiny camera (colonoscope) is used to examine your entire colon and rectum.  Hepatitis C blood test.  Hepatitis B blood test.  Sexually transmitted disease (STD) testing.  Diabetes screening. This is done by  checking your blood sugar (glucose) after you have not eaten for a while (fasting). You may have this done every 1-3 years.  Mammogram. This may be done every 1-2 years. Talk to your health care provider about when you should start having regular mammograms. This may depend on whether you have a family history of breast cancer.  BRCA-related cancer screening. This may be done if you have a family history of breast, ovarian, tubal, or peritoneal cancers.  Pelvic exam and Pap test. This may be done every 3 years starting at age 13. Starting at age 86, this may be done every 5 years if you have a Pap test in combination with an HPV test.  Bone density scan. This is done to screen for osteoporosis. You may have this scan if you are at high risk for osteoporosis. Discuss your test results, treatment options, and if necessary, the need for more tests with your health care provider. Vaccines Your health care provider may recommend certain vaccines, such as:  Influenza vaccine. This is recommended every year.  Tetanus, diphtheria, and acellular pertussis (Tdap, Td) vaccine. You may need a Td booster every 10 years.  Varicella vaccine. You may need this if you have not been vaccinated.  Zoster vaccine. You may need this after age 31.  Measles, mumps, and rubella (MMR) vaccine. You may need at least one dose of MMR if you were born in 1957 or later. You may also need a second dose.  Pneumococcal 13-valent conjugate (PCV13) vaccine. You may need this if you have certain conditions and were not previously vaccinated.  Pneumococcal polysaccharide (PPSV23) vaccine. You may need one or two doses if you smoke cigarettes or if you have certain conditions.  Meningococcal vaccine. You may need this if you have certain conditions.  Hepatitis A vaccine. You may need this if you have certain conditions or if you travel or work in places where you may be exposed to hepatitis A.  Hepatitis B vaccine. You may  need this if you have certain conditions or if you travel or work in places where you may be exposed to hepatitis B.  Haemophilus influenzae type b (Hib) vaccine. You may need this if you have certain conditions. Talk to your health care provider about which screenings and vaccines you need and how often you need them. This information is not intended to replace advice given to you by your health care provider. Make sure you discuss any questions you have with your health care provider. Document Released: 05/23/2015 Document Revised: 06/16/2017 Document Reviewed: 02/25/2015 Elsevier Interactive Patient Education  2019 [employer].         No follow-ups on file.  Jarrett Soho  Vista Lawman, DO

## 2018-05-16 ENCOUNTER — Encounter: Payer: Self-pay | Admitting: Family Medicine

## 2018-05-16 ENCOUNTER — Ambulatory Visit (INDEPENDENT_AMBULATORY_CARE_PROVIDER_SITE_OTHER): Payer: BLUE CROSS/BLUE SHIELD | Admitting: Family Medicine

## 2018-05-16 VITALS — BP 90/70 | HR 82 | Temp 98.3°F | Ht 65.5 in | Wt 178.0 lb

## 2018-05-16 DIAGNOSIS — Z0000 Encounter for general adult medical examination without abnormal findings: Secondary | ICD-10-CM

## 2018-05-16 DIAGNOSIS — Z23 Encounter for immunization: Secondary | ICD-10-CM | POA: Diagnosis not present

## 2018-05-16 LAB — LIPID PANEL
CHOL/HDL RATIO: 3
Cholesterol: 257 mg/dL — ABNORMAL HIGH (ref 0–200)
HDL: 86.1 mg/dL (ref >39.00)
LDL Cholesterol: 155 mg/dL — ABNORMAL HIGH (ref 0–99)
NonHDL: 170.53
Triglycerides: 79 mg/dL (ref 0.0–149.0)
VLDL: 15.8 mg/dL (ref 0.0–40.0)

## 2018-05-16 LAB — HEMOGLOBIN A1C: HEMOGLOBIN A1C: 5.6 % (ref 4.6–6.5)

## 2018-05-16 NOTE — Addendum Note (Signed)
Addended by: Johnella Moloney on: 05/16/2018 09:10 AM   Modules accepted: Orders

## 2018-05-16 NOTE — Patient Instructions (Addendum)
BEFORE YOU LEAVE: -flu shot -tetanus booster -obtain mammogram, pap data from gyn -follow up: yearly for physical, sooner as needed  We have ordered labs or studies at this visit. It can take up to 1-2 weeks for results and processing. IF results require follow up or explanation, we will call you with instructions. Clinically stable results will be released to your [facility]. If you have not heard from [place] or cannot find your results in [facility] in 2 weeks please contact our office at [phone].  If you are not yet signed up for [facility], please consider signing up.   Preventive Care 40-64 Years, Female Preventive care refers to lifestyle choices and visits with your health care provider that can promote health and wellness. What does preventive care include?   A yearly physical exam. This is also called an annual well check.  Dental exams once or twice a year.  Routine eye exams. Ask your health care provider how often you should have your eyes checked.  Personal lifestyle choices, including: ? Daily care of your teeth and gums. ? Regular physical activity. ? Eating a healthy diet. ? Avoiding tobacco and drug use. ? Limiting alcohol use. ? Practicing safe sex. ? Taking vitamin and mineral supplements as recommended by your health care provider. What happens during an annual well check? The services and screenings done by your health care provider during your annual well check will depend on your age, overall health, lifestyle risk factors, and family history of disease. Counseling Your health care provider may ask you questions about your:  Alcohol use.  Tobacco use.  Drug use.  Emotional well-being.  Home and relationship well-being.  Sexual activity.  Eating habits.  Work and work Statistician.  Method of birth control.  Menstrual cycle.  Pregnancy history. Screening You may have the following tests or measurements:  Height, weight, and BMI.  Blood  pressure.  Lipid and cholesterol levels. These may be checked every 5 years, or more frequently if you are over 62 years old.  Skin check.  Lung cancer screening. You may have this screening every year starting at age 6 if you have a 30-pack-year history of smoking and currently smoke or have quit within the past 15 years.  Colorectal cancer screening. All adults should have this screening starting at age 45 and continuing until age 80. Your health care provider may recommend screening at age 15. You will have tests every 1-10 years, depending on your results and the type of screening test. People at increased risk should start screening at an earlier age. Screening tests may include: ? Guaiac-based fecal occult blood testing. ? Fecal immunochemical test (FIT). ? Stool DNA test. ? Virtual colonoscopy. ? Sigmoidoscopy. During this test, a flexible tube with a tiny camera (sigmoidoscope) is used to examine your rectum and lower colon. The sigmoidoscope is inserted through your anus into your rectum and lower colon. ? Colonoscopy. During this test, a long, thin, flexible tube with a tiny camera (colonoscope) is used to examine your entire colon and rectum.  Hepatitis C blood test.  Hepatitis B blood test.  Sexually transmitted disease (STD) testing.  Diabetes screening. This is done by checking your blood sugar (glucose) after you have not eaten for a while (fasting). You may have this done every 1-3 years.  Mammogram. This may be done every 1-2 years. Talk to your health care provider about when you should start having regular mammograms. This may depend on whether you have a family history  of breast cancer.  BRCA-related cancer screening. This may be done if you have a family history of breast, ovarian, tubal, or peritoneal cancers.  Pelvic exam and Pap test. This may be done every 3 years starting at age 105. Starting at age 27, this may be done every 5 years if you have a Pap test in  combination with an HPV test.  Bone density scan. This is done to screen for osteoporosis. You may have this scan if you are at high risk for osteoporosis. Discuss your test results, treatment options, and if necessary, the need for more tests with your health care provider. Vaccines Your health care provider may recommend certain vaccines, such as:  Influenza vaccine. This is recommended every year.  Tetanus, diphtheria, and acellular pertussis (Tdap, Td) vaccine. You may need a Td booster every 10 years.  Varicella vaccine. You may need this if you have not been vaccinated.  Zoster vaccine. You may need this after age 31.  Measles, mumps, and rubella (MMR) vaccine. You may need at least one dose of MMR if you were born in 1957 or later. You may also need a second dose.  Pneumococcal 13-valent conjugate (PCV13) vaccine. You may need this if you have certain conditions and were not previously vaccinated.  Pneumococcal polysaccharide (PPSV23) vaccine. You may need one or two doses if you smoke cigarettes or if you have certain conditions.  Meningococcal vaccine. You may need this if you have certain conditions.  Hepatitis A vaccine. You may need this if you have certain conditions or if you travel or work in places where you may be exposed to hepatitis A.  Hepatitis B vaccine. You may need this if you have certain conditions or if you travel or work in places where you may be exposed to hepatitis B.  Haemophilus influenzae type b (Hib) vaccine. You may need this if you have certain conditions. Talk to your health care provider about which screenings and vaccines you need and how often you need them. This information is not intended to replace advice given to you by your health care provider. Make sure you discuss any questions you have with your health care provider. Document Released: 05/23/2015 Document Revised: 06/16/2017 Document Reviewed: 02/25/2015 Elsevier Interactive Patient  Education  2019 [employer].

## 2018-05-17 ENCOUNTER — Encounter: Payer: Self-pay | Admitting: Family Medicine

## 2019-05-31 LAB — HM MAMMOGRAPHY

## 2019-06-07 ENCOUNTER — Encounter: Payer: Self-pay | Admitting: Family Medicine
